# Patient Record
Sex: Female | Born: 1970 | Race: White | Hispanic: No | Marital: Married | Smoking: Never smoker
Health system: Southern US, Community
[De-identification: ages and names within clinical notes are randomized; demographics above are authoritative.]

## PROBLEM LIST (undated history)

## (undated) DIAGNOSIS — I471 Supraventricular tachycardia: Secondary | ICD-10-CM

## (undated) DIAGNOSIS — Z98891 History of uterine scar from previous surgery: Secondary | ICD-10-CM

## (undated) HISTORY — DX: History of uterine scar from previous surgery: Z98.891

## (undated) HISTORY — DX: Supraventricular tachycardia: I47.1

---

## 2006-01-25 ENCOUNTER — Encounter: Admission: RE | Admit: 2006-01-25 | Discharge: 2006-01-25 | Payer: Self-pay | Admitting: Obstetrics and Gynecology

## 2006-07-05 ENCOUNTER — Ambulatory Visit: Payer: Self-pay | Admitting: Internal Medicine

## 2006-08-16 ENCOUNTER — Ambulatory Visit: Payer: Self-pay | Admitting: Internal Medicine

## 2006-11-01 ENCOUNTER — Ambulatory Visit: Payer: Self-pay | Admitting: Internal Medicine

## 2006-12-04 ENCOUNTER — Inpatient Hospital Stay (HOSPITAL_COMMUNITY): Admission: AD | Admit: 2006-12-04 | Discharge: 2006-12-04 | Payer: Self-pay | Admitting: Obstetrics and Gynecology

## 2006-12-07 ENCOUNTER — Inpatient Hospital Stay (HOSPITAL_COMMUNITY): Admission: AD | Admit: 2006-12-07 | Discharge: 2006-12-07 | Payer: Self-pay | Admitting: Obstetrics and Gynecology

## 2006-12-07 IMAGING — US US FETAL BPP W/O NONSTRESS
1 series · 14 of 17 positions shown · non-contrast
Comparison: none

OBSTETRICAL ULTRASOUND:

 This ultrasound exam was performed in the [HOSPITAL] Ultrasound Department.  The OB US report was generated in the AS system, and faxed to the ordering physician.  This report is also available in [REDACTED] PACS.

[Series 1: us ob comp +14 wk · 14 of 17 slices shown]
[im 1/17]
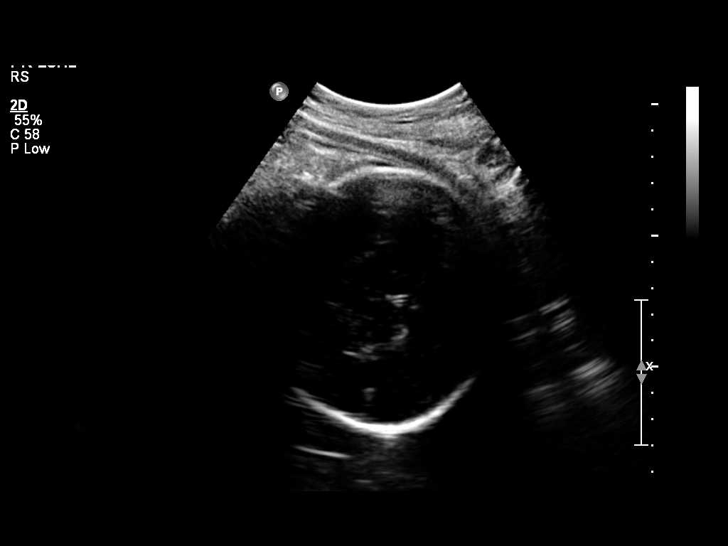
[im 2/17]
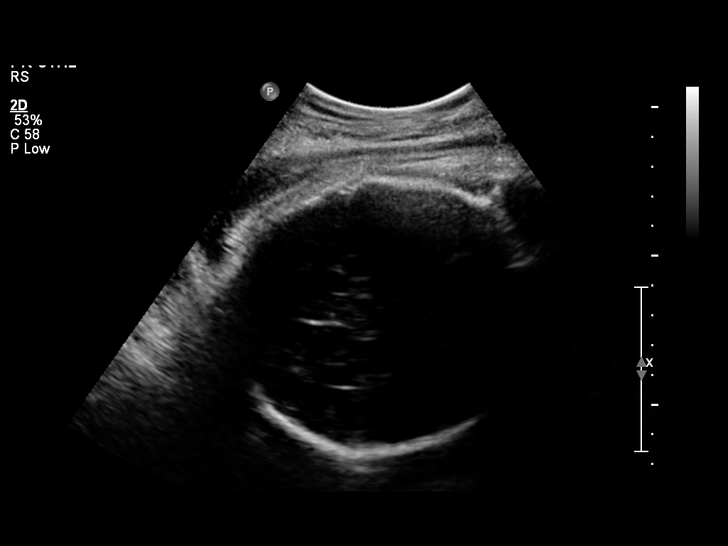
[im 4/17]
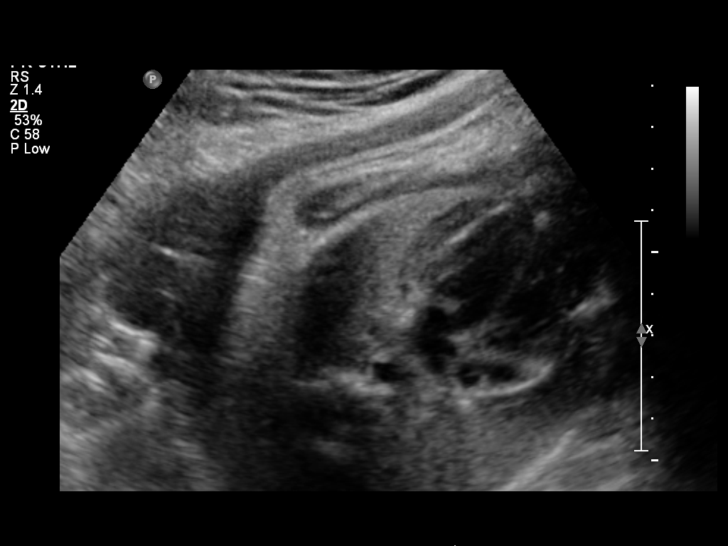
[im 5/17]
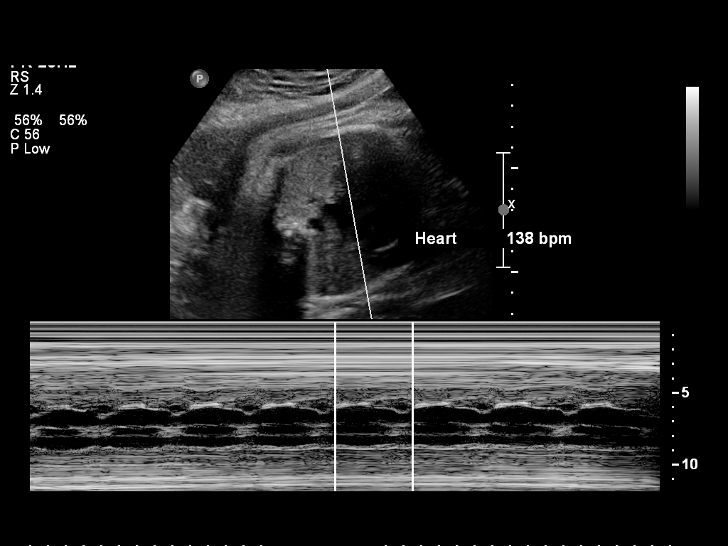
[im 6/17]
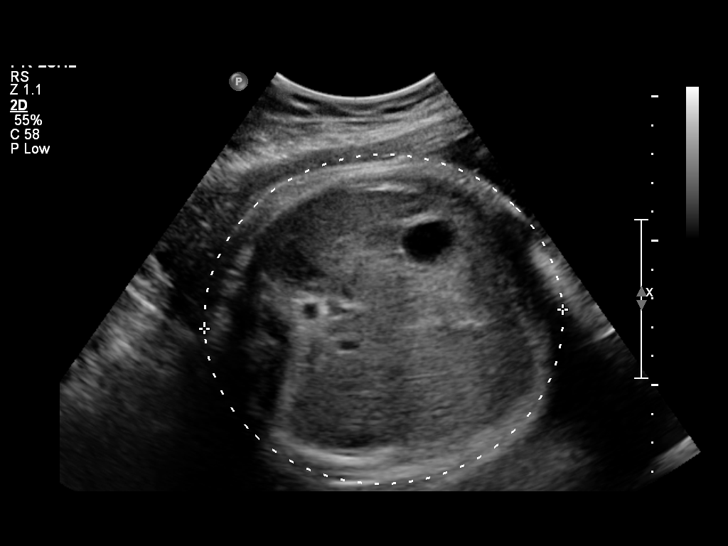
[im 7/17]
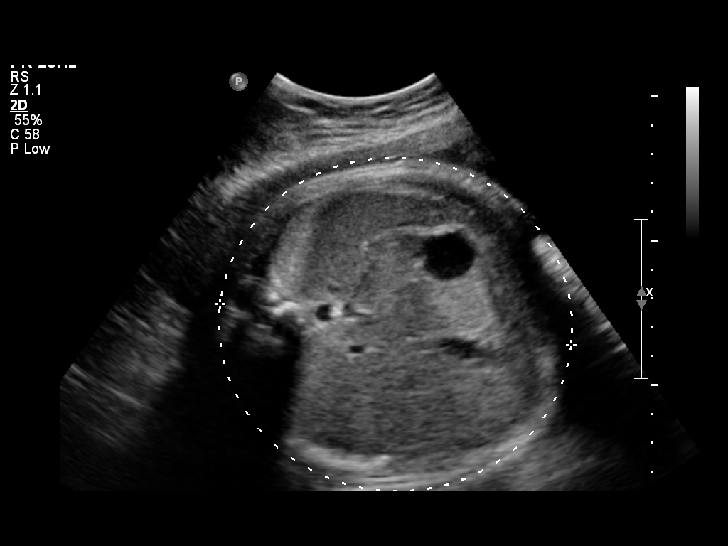
[im 8/17]
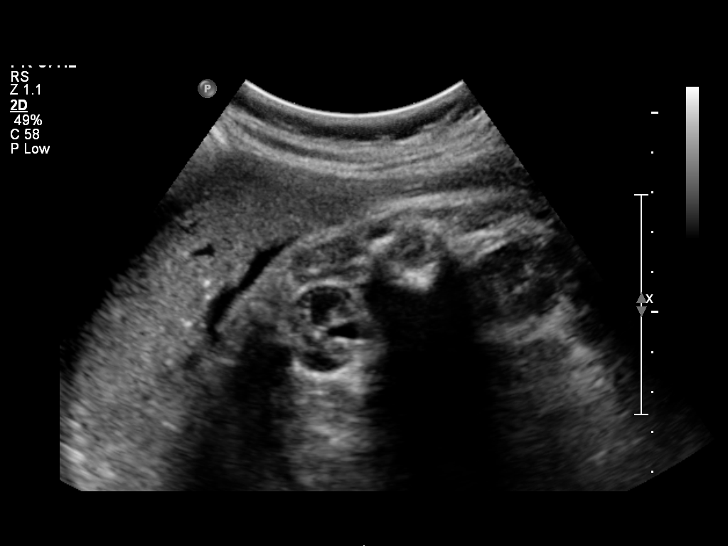
[im 10/17]
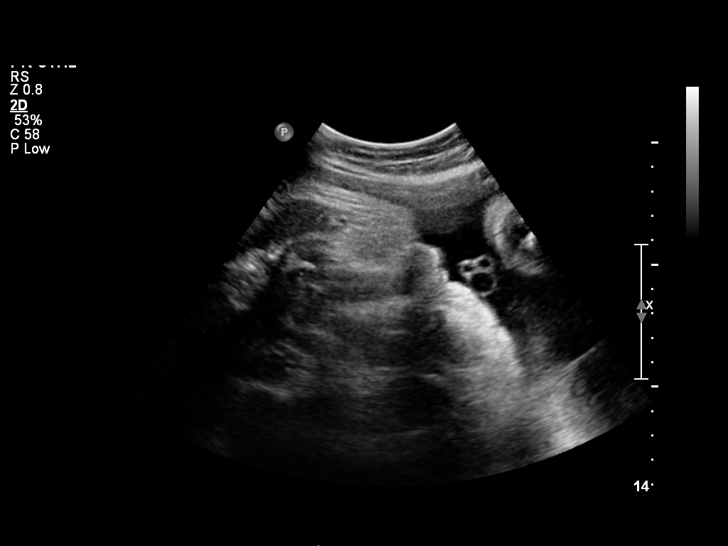
[im 11/17]
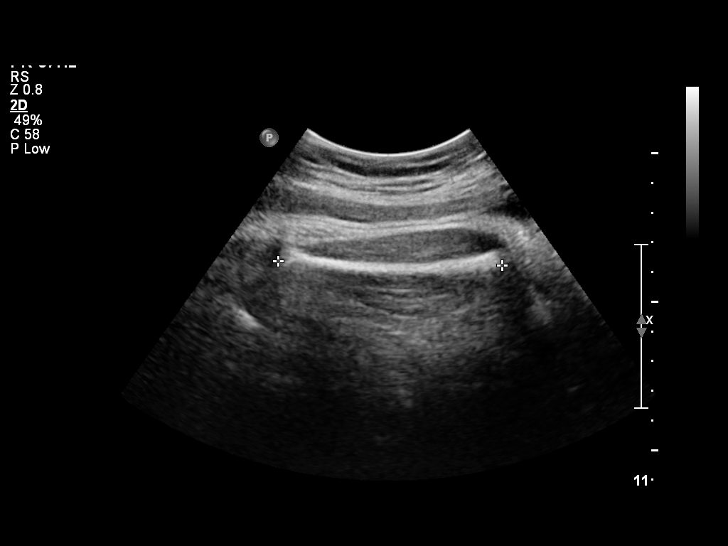
[im 12/17]
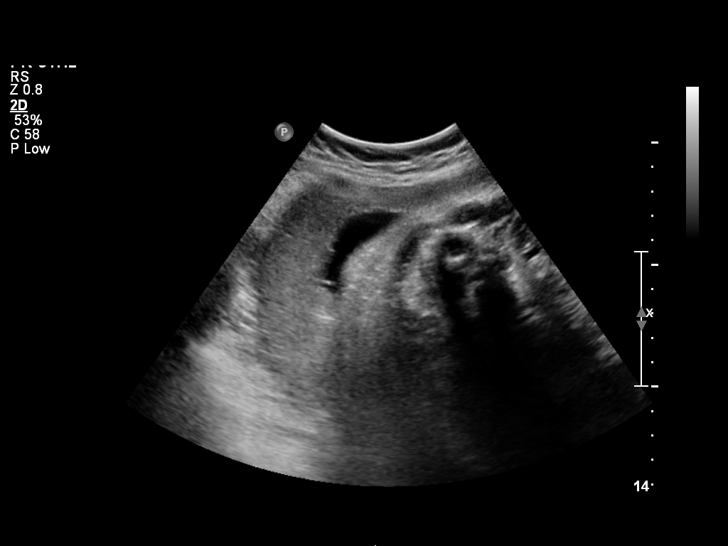
[im 13/17]
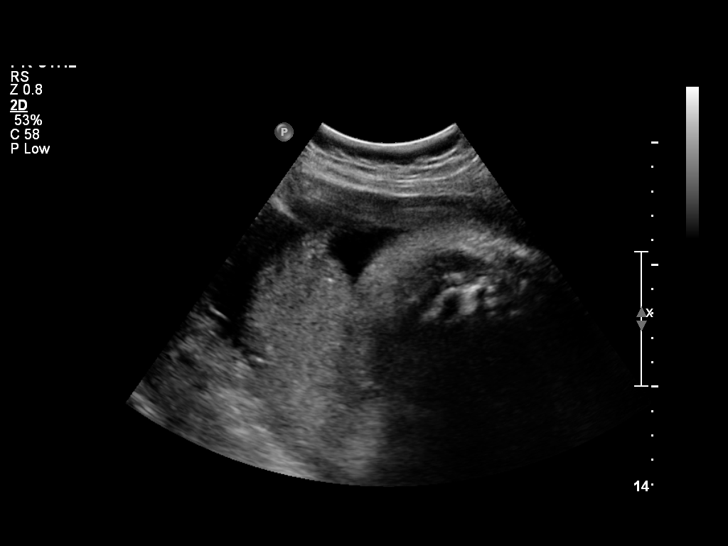
[im 14/17]
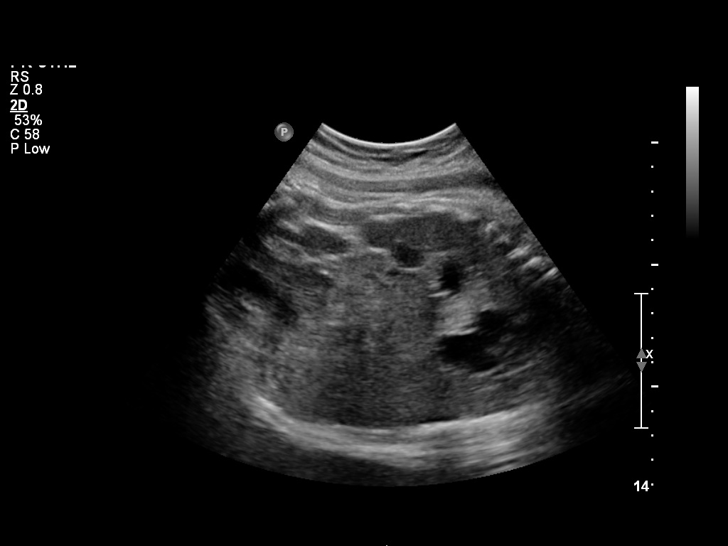
[im 16/17]
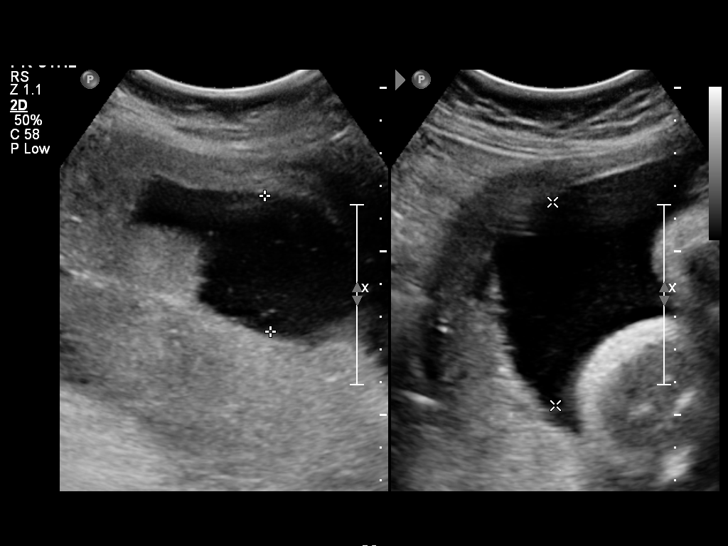
[im 17/17]
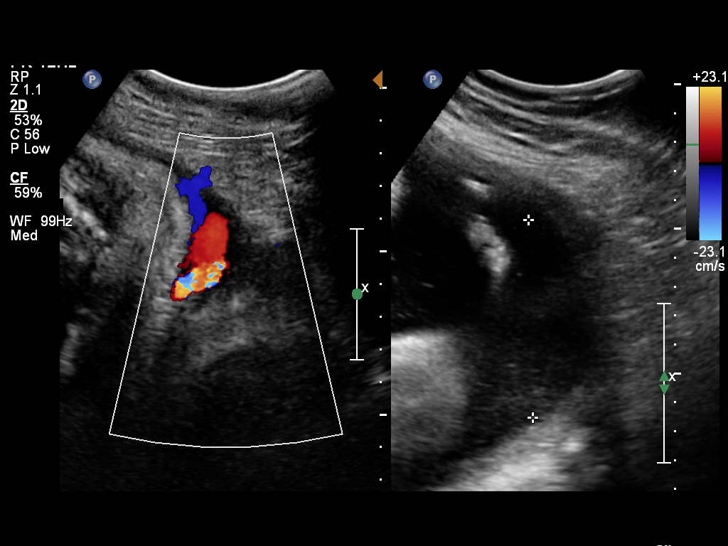

[14 of 17 positions shown; findings below may reference images not displayed]

IMPRESSION: See AS Obstetric US report.

## 2006-12-11 ENCOUNTER — Inpatient Hospital Stay (HOSPITAL_COMMUNITY): Admission: RE | Admit: 2006-12-11 | Discharge: 2006-12-14 | Payer: Self-pay | Admitting: Obstetrics and Gynecology

## 2007-06-12 DIAGNOSIS — I471 Supraventricular tachycardia: Secondary | ICD-10-CM

## 2007-06-12 HISTORY — PX: CARDIAC ELECTROPHYSIOLOGY STUDY AND ABLATION: SHX1294

## 2007-06-12 HISTORY — DX: Supraventricular tachycardia: I47.1

## 2007-10-27 ENCOUNTER — Ambulatory Visit: Payer: Self-pay | Admitting: Internal Medicine

## 2008-03-19 ENCOUNTER — Ambulatory Visit (HOSPITAL_COMMUNITY): Admission: RE | Admit: 2008-03-19 | Discharge: 2008-03-19 | Payer: Self-pay | Admitting: Internal Medicine

## 2008-03-19 ENCOUNTER — Ambulatory Visit: Payer: Self-pay | Admitting: Internal Medicine

## 2009-01-19 DIAGNOSIS — Z98891 History of uterine scar from previous surgery: Secondary | ICD-10-CM

## 2009-01-19 HISTORY — DX: History of uterine scar from previous surgery: Z98.891

## 2009-03-10 ENCOUNTER — Ambulatory Visit: Payer: Self-pay | Admitting: Internal Medicine

## 2009-03-10 DIAGNOSIS — I471 Supraventricular tachycardia, unspecified: Secondary | ICD-10-CM | POA: Insufficient documentation

## 2009-03-10 DIAGNOSIS — I498 Other specified cardiac arrhythmias: Secondary | ICD-10-CM

## 2010-05-01 ENCOUNTER — Telehealth (INDEPENDENT_AMBULATORY_CARE_PROVIDER_SITE_OTHER): Payer: Self-pay | Admitting: *Deleted

## 2010-07-11 NOTE — Progress Notes (Signed)
  Phone Note Other Incoming   Request: Send information Summary of Call: Request received from [facility] forwarded to [facility].  .    

## 2010-08-01 ENCOUNTER — Telehealth (INDEPENDENT_AMBULATORY_CARE_PROVIDER_SITE_OTHER): Payer: Self-pay | Admitting: *Deleted

## 2010-08-08 NOTE — Progress Notes (Addendum)
  ParaMeds Request received sent to [facility]  August 01, 2010 11:29 AM    Appended Document:  EMSI request received sent to [facility]

## 2010-10-24 NOTE — Op Note (Signed)
NAMEJOYLYNN, Shannon Harvey          ACCOUNT NO.:  [id number]   MEDICAL RECORD NO.:  [id number]          PATIENT TYPE:  OIB   LOCATION:  2023                         FACILITY:  [facility]   PHYSICIAN:  Doylene Canning. 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   INTRODUCTION:  The patient is a 40 year old woman with a history of  tachy palpitations and she was adolescent.  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  The patient has had  persistent episodes of SVT which were quite severe when she was pregnant  with her first child.  The patient is now interested in getting pregnant  again and is referred for electrophysiologic study and catheter  ablation.   PROCEDURE:  After informed consent was obtained, the patient was taken  diagnostic EP Lab in a fasting state.  After usual preparation and  draping, intravenous fentanyl and midazolam was given for sedation.  A 6-  [place] hexapolar catheter was inserted percutaneously in the right  jugular vein and advanced under fluoroscopic guidance to the coronary  sinus.  A 5-French quadripolar catheter was inserted percutaneously in  the right femoral vein and advanced to the RV septum.  A 5-French  quadripolar catheter was inserted percutaneously in the right femoral  vein and advanced the His bundle region.  After measurement of basic  intervals, rapid ventricular pacing was carried out from the RV apex and  stepwise decreased down to 580 milliseconds where VA Wenckebach was  observed.  During rapid ventricular pacing, the atrial activation  sequence was midline and  decremental.  Next, programmed ventricular  stimulation was carried out from the RV apex at base drive cycle length  of 540 milliseconds.  The S1-S2 interval stepwise decreased down to 530  milliseconds where retrograde AV node ERP was observed.  During  programmed ventricular stimulation, the atrial activation was midline  and decremental.  Next, programmed atrial stimulation was carried out  from the coronary sinus in high right atrium at base drive cycle length  of 981 milliseconds.  The S1-S2 interval stepwise decreased from 500  milliseconds down to 370 milliseconds where AV node ERP was observed.  During programmed atrial stimulation, there were multiple AH jumps and  only rare echo beats noted.  The PR interval was greater than the RR  interval.  Next, rapid atrial pacing was carried out from the coronary  sinus at base drive cycle length of 191 milliseconds stepwise decreased  down to 430 milliseconds where AV Wenckebach was observed.  During rapid  atrial pacing, the PR interval was greater than the RR interval.  At  this point, isoproterenol was infused at a rate of 1 mcg per minute.  Additional rapid atrial pacing was carried out resulting in the  initiation of SVT (AV node reentrant tachycardia) which was at a cycle  length of approximately around 420 milliseconds.  PVCs placed at time of  His bundle refractures did not preset the atrium and during ventricular  pacing, the tachycardia was terminated during V-pacing.  With this  information in hand, diagnosis of AV node reentrant tachycardia was  made.  Isoproterenol was discontinued, and a 7-French quadripolar  ablation catheter was inserted percutaneously in the right femoral vein  and advanced to right atrium.  Mapping was carried out in [place] triangle.  In the slow pathway region between sites 8 and 10, a total of 2 RF  energy applications were delivered.  During RF energy application, there  was accelerated junctional  rhythm.  Following RF energy application,  additional attempts to re-induce the tachycardia while on isoproterenol  were carried out and this demonstrated that the PR interval was now less  than the RR interval and in fact, there was no evidence of any residual  slow pathway conduction.  At this point, the catheters were removed  after the patient was observed for approximately 10 minutes.  Hemostasis  was assured, and the patient was returned to her room in satisfactory  condition.   COMPLICATIONS:  There were no immediate procedural complications.   RESULTS:  1. Baseline ECG:  Baseline ECG demonstrates sinus rhythm with normal      axis and intervals.  2. Baseline intervals:  Sinus node cycle length was 1055 milliseconds.      The QRS duration was 70 milliseconds.  The PR interval was 170      milliseconds.  3. Rapid ventricular pacing.  Rapid ventricular pacing was carried out      from the RV apex and stepwise decreased down to 580 milliseconds      where VA Wenckebach was observed.  During rapid ventricular pacing,      the atrial activation was midline and decremental.  4. Deep programmed ventricular stimulation.  Programmed ventricular      dilation was carried out the RV apex at base drive cycle length of      600 milliseconds.  The S1-S2 interval was stepwise decreased down      to 530 milliseconds where retrograde AV node ERP was observed.      During programmed ventricular stimulation, the atrial activation      was midline and decremental.  5. Rapid atrial pacing.  Rapid atrial pacing was carried out from the      coronary sinus and high right atrium at a base drive cycle length      of 600 milliseconds, was stepwise decreased down to 430      milliseconds where AV Wenckebach was observed.  Prior to RF energy      application, pacing down near the AV Wenckebach cycle length      resulted in the PR interval being greater than the RR interval, but      there was no induced  SVT.  Following isoproterenol infusion, rapid      atrial pacing resulted in easily inducible SVT.  6. Programmed atrial stimulation.  Programmed atrial stimulation was      carried out from the coronary sinus in high atrium at base drive      cycle length of 600 milliseconds.  The S1-S2 interval was stepwise      decreased from 500 milliseconds down to 370 milliseconds with AV      node ERP was observed.  During  programmed atrial stimulation, there      were multiple AH jumps and echo beats.  Following ablation, there      were none.   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      Doylene Canning. Ladona Ridgel, MD  Electronically Signed     GWT/MEDQ  D:  03/19/2008  T:  03/20/2008  Job:  [id number]   cc:   Marcelino Duster L. Vincente Poli, M.D.

## 2010-10-24 NOTE — Op Note (Signed)
Shannon Harvey, Shannon Harvey          ACCOUNT NO.:  [id number]   MEDICAL RECORD NO.:  [id number]          PATIENT TYPE:  INP   LOCATION:  9148                          FACILITY:  [facility]   PHYSICIAN:  Dineen Kid. Rana Snare, M.D.    DATE OF BIRTH:  December 10, 1970   DATE OF PROCEDURE:  12/11/2006  DATE OF DISCHARGE:                               OPERATIVE REPORT   PREOPERATIVE DIAGNOSIS:  Intrauterine pregnancy at 38 weeks, arrest of  descent.   POSTOPERATIVE DIAGNOSIS:  Intrauterine pregnancy at 38 weeks, arrest of  descent plus macrosomia.   PROCEDURES:  Primary low segment transverse cesarean section.   SURGEON:  Dr. Candice Camp.   ANESTHESIA:  Was epidural.   INDICATIONS:  The patient is a 41 year old G1 at 66 weeks who is  admitted for induction of labor due to a large gestational age baby and  favorable cervix.  She progressed to complete, pushed for 2-1/2 hours  and was unable to get the fetal vertex below +1 station.  We proceeded  with primary low segment transverse cesarean section for arrest of  descent.   FINDINGS:  Viable female infant, Apgars were 09/09, pH arterial 7.32,  weight is 8 pounds 12 ounces.   DESCRIPTION OF PROCEDURE:  After adequate analgesia the patient placed  in the supine position left lateral tilt.  She is sterilely prepped and  draped.  Bladder sterilely drained with Foley catheter.  The  Pfannenstiel skin incision was made two fingerbreadths above the pubic  symphysis taken down sharply to fascia which was incised transversely  and superiorly and extended inferiorly off the bellies of the rectus  muscle which were separated sharply in midline.  Peritoneum was entered  sharply.  Bladder flap created and placed behind the bladder blade.  Low  segment myotomy incision was made down to vertex.  The incision was  extended laterally, fetal vertex was delivered atraumatically.  The  nares and pharynx were suctioned.  Nuchal cord x1 was reduced.  Cord  clamped, cut and  handed to the pediatrician for resuscitation.  The cord  blood was obtained.  Placenta extracted manually.  The uterus was  exteriorized, wiped clean with a dry lap.  The myotomy incision closed  in two layers, first being a running locking layer, the second being  imbricating layer of 0 Monocryl suture.  Uterus was placed back in the  peritoneal cavity and after copious amount of irrigation adequate  hemostasis was assured.  Peritoneum was closed with 0 Monocryl.  Rectus  muscle plicated in midline.  Irrigation applied and after adequate  hemostasis the fascia was closed with a #1 Vicryl in a running fashion.  Irrigation applied and after hemostasis, skin stapled.  Steri-Strips  applied.  The patient tolerated procedure well, was stable on transfer  to recovery room.  Sponge and instrument count was normal x3.  Estimated  blood loss was 600 mL. The patient received 1 gram Rocephin after  delivery placenta.      Dineen Kid Rana Snare, M.D.  Electronically Signed     DCL/MEDQ  D:  12/11/2006  T:  12/11/2006  Job:  [id number]

## 2010-10-24 NOTE — Assessment & Plan Note (Signed)
[facility]                         ELECTROPHYSIOLOGY OFFICE NOTE   Shannon Harvey, FULMORE                   MRN:          [id number]  DATE:11/01/2006                            DOB:          21-Nov-1970    Ms. Shannon Harvey returns today for followup.  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  The patient states that despite being on Toprol XL 50 a day, she  continues to have episodes of tachy palpitations, which will awaken her  from sleep tonight.  Typically, a vagal maneuver or just a deep breath  will result in termination, as she easily falls back to sleep.  She  denies any syncope.  She denies shortness of breath.  She denies  peripheral edema or hypertension.  She tells me that her baby's heart  rate has been well up in the 150s to 160s range, which apparently is  normal for this period of pregnancy.   Her medications include metoprolol XL 50 a day and prenatal vitamins.   PHYSICAL EXAMINATION:  GENERAL:  Notable for a pleasant, well-appearing  40 year old 34-week pregnant woman in no acute distress.  VITAL SIGNS:  Blood pressure 122/72, pulse 80 and regular, respirations  18.  Weight was 230 pounds.  NECK:  No jugular venous distention.  LUNGS:  Clear bilaterally to auscultation.  There are no wheezes, rales  or rhonchi.  CARDIOVASCULAR:  Regular rate and rhythm with a normal S1 and S2.  EXTREMITIES:  No clubbing or cyanosis. There was a very trace amount of  peripheral edema.   EKG demonstrates a sinus rhythm with sinus arrhythmia.  The P-R interval  is normal.   IMPRESSION:  1. Recurrent supraventricular tachycardia.  2. A 34-week gestation.   DISCUSSION:  Overall, Shannon Harvey is stable.  I have recommended  that she continue on with her Toprol at the present time.  She could  decrease the dose to 25 mg a day around the time of her expected  delivery.  If she ultimately  ends up developing worsening SVT, then she  could certainly go ahead and go back up to 50 mg daily, provided there  was no evidence of fetal bradycardia.  At this point, I have not  recommended followup until after she has delivered.  Should she desire  to proceed with catheter ablation following the delivery of her child,  then we would certainly be happy to arrange this, and I have asked her  to call after her baby is born so we could meet about proceeding with  ablation therapy.     Doylene Canning. Ladona Ridgel, MD  Electronically Signed   GWT/MedQ  DD: 11/01/2006  DT: 11/01/2006  Job #: [id number]   cc:   Marcelino Duster L. Vincente Poli, M.D.

## 2010-10-24 NOTE — Assessment & Plan Note (Signed)
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  Shannon Harvey is a very pleasant  young woman with a history of SVT which flared up fairly substantially  during her pregnancy.  Shannon Harvey was placed on beta blockers and had initially  fairly good control of her SVT.  Shannon Harvey returns today for followup.  The  patient's EKG demonstrates no pre-excitation.  Shannon Harvey does have  palpitations which occur daily now that Shannon Harvey is no longer pregnant.  Shannon Harvey  is able to terminate these with Valsalva maneuvers.  Shannon Harvey has no other  specific complaints today.  Her daughter, who is with her today, is 25  months old and is doing quite well.  Shannon Harvey is  currently on no  medications. During pregnancy, Shannon Harvey was on metoprolol at a low-dose.   PHYSICAL EXAMINATION:  GENERAL:  Shannon Harvey is a pleasant young woman in no  distress.  VITAL SIGNS:  Blood pressure 118/75, pulse 58 and regular, respirations  were 18.  Weight was 199 pounds.  NECK:  Revealed no jugular venous distention.  LUNGS:  Clear bilaterally to auscultation.  No wheezes, rales or rhonchi  are present.  CARDIOVASCULAR:  Regular rate and rhythm with normal S1-S2. No murmurs,  rubs or gallops present.  EXTREMITIES:  Demonstrate no cyanosis, clubbing or edema.  Pulses were  2+ symmetric.   Her EKG demonstrates sinus rhythm with sinus bradycardia.  There no  other significant findings.   IMPRESSION:  Recurrent supraventricular tachycardia.   DISCUSSION:  I have discussed treatment options with Dr. Ihor Gully in  detail. The risks, benefits, goals, and expectations of catheter  ablation of her SVT were discussed, and Shannon Harvey wishes to proceed. This will  be scheduled for the earliest possible convenient time.     Doylene Canning. Ladona Ridgel, MD  Electronically Signed    GWT/MedQ  DD: 10/27/2007  DT: 10/27/2007  Job #: [id number]   cc:   Marcelino Duster L. Vincente Poli, M.D.

## 2010-10-27 NOTE — Assessment & Plan Note (Signed)
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  She is a very pleasant, 40 year old woman,  who has a history of tachy palpitations and documented SVT, but is also  five months pregnant, returns today for followup.  I initially had seen  her back in January, when she had been recently diagnosed with being  pregnant, but also had recurrent episodes of SVT.  At that time, we  placed her initially on beta blockers, initially starting her at 25 mg a  day of Toprol and increasing the dose to 50 mg a day.  She states that,  with the initiation of her beta blockers, her palpitations were reduced  in frequency and then were eliminated altogether with the initiation of  50 mg a day of long-acting metoprolol.  The patient denies chest pain,  has otherwise been stable with regard to her pregnancy.   PHYSICAL EXAM:  She is a pleasant, well-appearing, young woman, in no  acute distress.  Her blood pressure was 128/72, the pulse 78 and  regular, respirations were 18, the weight was 213 pounds.  NECK:  Revealed no jugular venous distention.  LUNGS:  Clear bilaterally to auscultation, no wheezes, rales or rhonchi.  CARDIOVASCULAR EXAM:  Revealed a regular rate and rhythm with normal S1  and S2.  EXTREMITIES:  Demonstrated no cyanosis, clubbing or edema.   MEDICATIONS INCLUDE:  1. Prenatal vitamin.  2. Metoprolol.  3. Lipil.   IMPRESSION:  1. Recurrent supraventricular tachycardia.  2. Five and a half-month gestation.   DISCUSSION:  Overall, Shannon Harvey is doing quite well and tolerating  her beta blocker very nicely and has had no significant SVT since we  last saw her.  I have recommended that she  continue on metoprolol XL 50  mg daily.  Several days prior to her delivery, we might consider  decreasing her dose to a half tablet daily.  The patient does have an  interest in proceeding with catheter ablation, which we will tentatively  plan to do approximately one month to six weeks after her pregnancy.     Shannon Canning. Ladona Ridgel, MD  Electronically Signed   GWT/MedQ  DD: 08/16/2006  DT: 08/16/2006  Job #: [id number]   cc:   Marcelino Duster L. Vincente Poli, M.D.

## 2010-10-27 NOTE — Assessment & Plan Note (Signed)
[facility]                         ELECTROPHYSIOLOGY OFFICE NOTE   Shannon Harvey, Shannon Harvey                   MRN:          [id number]  DATE:07/05/2006                            DOB:          01-21-1971    Ms. Augustus is referred today by Dr. Marcelle Overlie for evaluation  of SVT.  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  She is approximately 3-1/2  months pregnant.  She has a longstanding history of SVT which was  diagnosed initially in 1992 when she underwent an electrophysiology  study at the [facility].  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  The patient has been very stable with regard to her  symptomatic tachy palpitations.  These would typically be very rare,  occurring once every 2-3 years.  The patient has recently become  pregnant and since then, she has noted that her tachy palpitations have  increased in frequency and severity.  She does not have chest pain  particularly, or shortness of breath with these.  She does feel somewhat  fatigued.  They start and stop suddenly and can be terminated with vagal  maneuvers.  She is presently on no medications.   Additional past medical history is really notable for laparoscopy in  2005 secondary to endometriosis and hysteroscopy for removal of a  fibroid.   She has a family history notable for father with bypass surgery at age  32.  Her mother is alive and well at age 61.  She has multiple siblings  who are in good health.   On review of systems, she notes seasonal allergic rhinitis and  occasional menstrual dysfunction.  Of note, she is in the start of her  second trimester of pregnancy.  The rest of her review of systems was  negative except as noted in the HPI.   PHYSICAL EXAMINATION:  GENERAL:  She is a pleasant, well-appearing  young  woman in no distress.  VITAL SIGNS:  Blood pressure was 130/80, pulse 74 and regular,  respirations 18.  Weight was 202 pounds.  HEENT:  Normocephalic and atraumatic.  Pupils are equal and round.  Oropharynx was moist.  Sclerae are anicteric.  NECK:  No jugular venous distention.  There is no thyromegaly.  Trachea  is midline.  Carotids are 2+ and symmetric.  LUNGS:  Clear bilaterally to auscultation.  No wheezes, rales, or  rhonchi.  There is no increased work of breathing.  CARDIOVASCULAR:  Regular rate and rhythm with normal S1 and S2.  I do  not appreciate any murmurs, rubs or gallops.  ABDOMEN:  Soft and nontender.  There was no obvious organomegaly.  EXTREMITIES:  No clubbing, cyanosis or edema.  Pulses were 2+ and  symmetric.  NEUROLOGIC:  Alert and oriented x3 with cranial nerves intact.  Strength  is 5/5 and symmetric.   Her EKG demonstrates sinus rhythm with normal axis and intervals.  There  was sinus arrhythmia present.   IMPRESSION:  1. Recurrent supraventricular tachycardia, although  I do not have any      documentation of this, I have an electrophysiology study that      demonstrates inducible atrioventricular node reentry back in 1992,      for which she did not undergo catheter ablation, as at that time      the procedure was not very well understood and success rates were      not as good either.  2. Early second trimester pregnancy.  3. Borderline hypertension.   DISCUSSION:  I have discussed the treatment options with the patient,  and I have recommended that we start her on Toprol and will try her on  25 mg daily, increasing her to 50 mg daily, as necessary to control her  arrhythmias and for her blood pressure, as it tolerates it.  I will plan  to see her back in approximately six weeks.  Hopefully, her palpitations  will be well controlled, and particularly if the patient desires  additional pregnancies, catheter ablation of her tachycardia would  be  warranted.  I have discussed all of the issues with the patient and will  plan to see her back at that time.  If she has worsening tachycardic  palpitations, then she is instructed to call.  If her blood pressure  increases further, then we would also want to hear about this as well.     Doylene Canning. Ladona Ridgel, MD  Electronically Signed    GWT/MedQ  DD: 07/05/2006  DT: 07/05/2006  Job #: [id number]   cc:   Marcelino Duster L. Vincente Poli, M.D.

## 2010-10-27 NOTE — Discharge Summary (Signed)
NAMEAIMAN, Shannon Harvey          ACCOUNT NO.:  [id number]   MEDICAL RECORD NO.:  [id number]          PATIENT TYPE:  INP   LOCATION:  9141                          FACILITY:  [facility]   PHYSICIAN:  Zelphia Cairo, MD    DATE OF BIRTH:  Jan 15, 1971   DATE OF ADMISSION:  12/11/2006  DATE OF DISCHARGE:  12/14/2006                               DISCHARGE SUMMARY   ADMITTING DIAGNOSES:  1. Intrauterine pregnancy at 86 weeks estimated gestational age.  2. Induction of labor secondary to large for gestational age baby with      favorable cervix.   DISCHARGE DIAGNOSES:  1. Status post low transverse cesarean section.  2. Viable female infant.   PROCEDURE:  Primary low transverse cesarean section.   REASON FOR ADMISSION:  Please see written H&P.   HOSPITAL COURSE:  The patient is 33-year primigravida that was admitted  to [facility] at 38 weeks estimated gestational age for  an induction of labor secondary to a large for gestational age baby with  a favorable cervix.  On admission, vital signs were stable.  Fetal heart  tones in the 140s with acceleration.  Cervix was examined and found to  be 3 cm dilated, 75% effaced, vertex at a -3 station.  Artificial  rupture of membranes was performed revealing clear fluid.  The patient  did progress to full dilatation and after pushing for approximately 2-  1/2 hours without further descent beyond a 1+ station, decision was made  to proceed with a primary low transverse cesarean section.  The patient  was then transferred to the operating room where epidural was dosed to  an adequate surgical level.  A low transverse incision was made with  delivery of a viable female infant weighing 8 pounds 12 ounces, Apgars  of 9 at 1 and 9 at 5 minutes.  Arterial cord pH of 7.32.  The patient  tolerated the procedure well and was taken to the recovery room in  stable condition.   On postoperative day #1, the patient was without complaint.  Vital  signs  were stable.  Fundus was firm and nontender.  Abdominal dressing was  noted to be clean, dry and intact.  Laboratory findings revealed  hemoglobin of 10.6.  On postoperative day #2, the patient was without  complaint.  Vital signs remained stable.  She was afebrile.  Abdomen  soft.  Fundus firm and nontender.  Abdominal dressing had been removed  revealing an incision that was clean, dry and intact.  Postoperative day  #3, the patient was without complaint.  Vital signs remained stable.  She was  afebrile.  Fundus firm and nontender.  Incision was clean, dry  and intact.  Staples removed and the patient was later discharged home.   CONDITION ON DISCHARGE:  Stable.   DIET:  Regular as tolerated.   ACTIVITY:  No heavy lifting, no driving x2 weeks, no vaginal entry.   FOLLOW UP:  Patient to follow up in the office in one to two weeks for  incision check.  She is to call for temperature greater than 100  degrees, persistent nausea,  vomiting, heavy vaginal bleeding and/or  redness or drainage from incisional site.   DISCHARGE MEDICATIONS:  1. Percocet 5/325 #31 p.o. q.4-6h. p.r.n.  2. Motrin 600 mg every six hours.  3. Prenatal vitamins one p.o. daily.  4. Colace one p.o. daily p.r.n.      Julio Sicks, N.P.      Zelphia Cairo, MD  Electronically Signed    CC/MEDQ  D:  01/15/2007  T:  01/15/2007  Job:  [phone] [id number]

## 2010-10-27 NOTE — Letter (Signed)
July 05, 2006    Michelle L. Vincente Poli, M.D.  [address], [address]  [place], [place] [address]   RE:  [name], [name]  MRN:  [id number]  /  DOB:  January 02, 1971   Dear Marcelino Duster,   Thank you for referring Shannon Harvey for EP evaluation.  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  The patient's  symptoms have been very quiescent for many years, but since she became  pregnant, she has noticed increasingly frequent episodes of SVT, which  are typically short-lived, stopping with vagal maneuvers that she self-  directs.  Her exam was otherwise fairly unremarkable today, and her EKG  demonstrates sinus rhythm with no evidence of ventricular pre-  excitation.  Interestingly enough, her blood pressure was 130/80 in our  office, although I wonder whether it is normally increased.   I discussed the treatment options with Malyah in detail.  I recommended  that we start her on Toprol XL at 25 mg daily initially with increases  to 50 mg daily and if well tolerated, continue this throughout her  pregnancy with the potential for stopping the medication, perhaps one  week prior to delivery.  I have had experience with this dose of  medication for patients with symptomatic SVT and have had good success  in the past.  I plan to see the patient back in six weeks for  followup and medication adjustment as needed.  Please do not hesitate to  contact me for any additional questions or problems.  Thank you for  letting me help take care of [name].   I should note that after her delivery, we would recommend catheter  ablation of her SVT.    Sincerely,      Doylene Canning. Ladona Ridgel, MD  Electronically Signed    GWT/MedQ  DD: 07/05/2006  DT: 07/05/2006  Job #: [id number]

## 2011-03-27 LAB — CBC
HCT: 31.4 — ABNORMAL LOW
HCT: 34.7 — ABNORMAL LOW
Hemoglobin: 10.6 — ABNORMAL LOW
Hemoglobin: 11.9 — ABNORMAL LOW
MCHC: 33.8
MCHC: 34.3
MCV: 82.7
Platelets: 254
RBC: 4.2
RDW: 14.1 — ABNORMAL HIGH
RDW: 14.2 — ABNORMAL HIGH
WBC: 9.9

## 2016-07-04 ENCOUNTER — Ambulatory Visit (INDEPENDENT_AMBULATORY_CARE_PROVIDER_SITE_OTHER): Payer: Managed Care, Other (non HMO) | Admitting: Family Medicine

## 2016-07-04 ENCOUNTER — Encounter: Payer: Self-pay | Admitting: Family Medicine

## 2016-07-04 VITALS — BP 109/66 | HR 70 | Ht 69.0 in | Wt 199.0 lb

## 2016-07-04 DIAGNOSIS — R42 Dizziness and giddiness: Secondary | ICD-10-CM | POA: Diagnosis not present

## 2016-07-04 DIAGNOSIS — Z13220 Encounter for screening for lipoid disorders: Secondary | ICD-10-CM

## 2016-07-04 DIAGNOSIS — R7309 Other abnormal glucose: Secondary | ICD-10-CM

## 2016-07-04 NOTE — Progress Notes (Signed)
Subjective:    Patient ID: Shannon Harvey, female    DOB: 05/27/1971, 46 y.o.   MRN: [id number]  HPI 46 year old female comes in to establish care today. She does have a specific concern about some intermittent lightheadedness that she's been experiencing for about 2 months. Each time it only last for a few seconds.It is not related to change in position. She says when it's happened she's been able to check her glucose and her blood pressure at work and it's always normal. She denies any recent injury or trauma to the head. She has not had significant headaches with this. NO other neurologic sxs like numbness tingling or weakness. No recent vision changes.  Eye exam is up to date.  No recent URI but did have a lower respiratory tract infection a couple of moths ago.  No fever, chills ro sweats. No ear pain or throat pain.    She also has had a couple of episodes in her left ear where she could actually hear her heart beating. It wasn't painful.   Review of Systems  Constitutional: Negative for diaphoresis, fever and unexpected weight change.  HENT: Negative for hearing loss, rhinorrhea and tinnitus.   Eyes: Negative for visual disturbance.  Respiratory: Negative for cough and wheezing.   Cardiovascular: Negative for chest pain and palpitations.  Gastrointestinal: Negative for blood in stool, diarrhea, nausea and vomiting.  Genitourinary: Negative for difficulty urinating, vaginal bleeding and vaginal discharge.  Musculoskeletal: Positive for arthralgias. Negative for myalgias.       Left ankle - gettign surgery in Feb  Skin: Negative for rash.  Neurological: Negative for headaches.  Hematological: Negative for adenopathy. Does not bruise/bleed easily.  Psychiatric/Behavioral: Negative for dysphoric mood and sleep disturbance. The patient is not nervous/anxious.    No sinus issues. No other neurologic issues.   BP 109/66   Pulse 70   Ht 5\' 9"  (1.753 m)   Wt 199 lb (90.3 kg)    SpO2 99%   BMI 29.39 kg/m     Allergies not on file  Past Medical History:  Diagnosis Date  . H/O: C-section 01/19/2009  . SVT (supraventricular tachycardia) (HCC) 2009   Treated with ablation    Past Surgical History:  Procedure Laterality Date  . CARDIAC ELECTROPHYSIOLOGY STUDY AND ABLATION  2009  . CESAREAN SECTION  12/11/2006  . CESAREAN SECTION  01/19/2009    Social History   Social History  . Marital status: Married    Spouse name: Acupuncturistcott   . Number of children: 2  . Years of education: N/A   Occupational History  . Oral Surgeon    Social History Main Topics  . Smoking status: Never Smoker  . Smokeless tobacco: Never Used  . Alcohol use No  . Drug use: No  . Sexual activity: Yes    Partners: Male    Birth control/ protection: IUD   Other Topics Concern  . Not on file   Social History Narrative   3 caffeinated beverages per day. Walks for 30 minutes 3 times a week.    Family History  Problem Relation Age of Onset  . Alcoholism Father   . Cancer Father     Merkel Cell Carcinoma  . Heart attack Father   . Hyperlipidemia Father   . Diabetes type II Father   . Hypertension Father   . Stroke Father     No outpatient encounter prescriptions on file as of 07/04/2016.   No facility-administered encounter medications on  file as of 07/04/2016.          Objective:   Physical Exam  Constitutional: She is oriented to person, place, and time. She appears well-developed and well-nourished.  HENT:  Head: Normocephalic and atraumatic.  Right Ear: External ear normal.  Left Ear: External ear normal.  Nose: Nose normal.  Mouth/Throat: Oropharynx is clear and moist.  TMs and canals are clear.   Eyes: Conjunctivae and EOM are normal. Pupils are equal, round, and reactive to light.  Neck: Neck supple. No thyromegaly present.  Cardiovascular: Normal rate, regular rhythm and normal heart sounds.   Pulmonary/Chest: Effort normal and breath sounds normal. She  has no wheezes.  Lymphadenopathy:    She has no cervical adenopathy.  Neurological: She is alert and oriented to person, place, and time. No cranial nerve deficit.  Neg Diks Hallpike manuver  Skin: Skin is warm and dry. No pallor.  Psychiatric: She has a normal mood and affect. Her behavior is normal.  Vitals reviewed.      Assessment & Plan:  Lightheadedness-unclear etiology at this point. The episodes are very brief. She has a negative Dix-Hallpike maneuver. She really doesn't have any significant sinus symptoms. We'll start with some lab work just to rule out thyroid disorder, anemia etc. She could even consider doing an CAT scan of the sinuses just to make sure that there is no sign of chronic sinusitis or fluid or pressure. Her ear exam is normal today so that's reassuring as well. She's really not on any medications that should be causing her symptoms. Just encourage her to make sure that she is hydrating well and may want to limit some of her caffeine intake. Will call with results once available.

## 2016-07-06 ENCOUNTER — Encounter: Payer: Self-pay | Admitting: Family Medicine

## 2016-07-17 LAB — CBC WITH DIFFERENTIAL/PLATELET
BASOS PCT: 0 %
Basophils Absolute: 0 cells/uL (ref 0–200)
EOS ABS: 67 {cells}/uL (ref 15–500)
EOS PCT: 1 %
HCT: 37.9 % (ref 35.0–45.0)
Hemoglobin: 12.3 g/dL (ref 11.7–15.5)
Lymphocytes Relative: 21 %
Lymphs Abs: 1407 cells/uL (ref 850–3900)
MCH: 26.4 pg — ABNORMAL LOW (ref 27.0–33.0)
MCHC: 32.5 g/dL (ref 32.0–36.0)
MCV: 81.3 fL (ref 80.0–100.0)
MONOS PCT: 6 %
MPV: 9.2 fL (ref 7.5–12.5)
Monocytes Absolute: 402 cells/uL (ref 200–950)
NEUTROS ABS: 4824 {cells}/uL (ref 1500–7800)
Neutrophils Relative %: 72 %
PLATELETS: 248 10*3/uL (ref 140–400)
RBC: 4.66 MIL/uL (ref 3.80–5.10)
RDW: 14.1 % (ref 11.0–15.0)
WBC: 6.7 10*3/uL (ref 3.8–10.8)

## 2016-07-17 LAB — HEMOGLOBIN A1C
HEMOGLOBIN A1C: 5.1 % (ref <5.7)
Mean Plasma Glucose: 100 mg/dL

## 2016-07-18 LAB — COMPLETE METABOLIC PANEL WITH GFR
ALT: 26 U/L (ref 6–29)
AST: 17 U/L (ref 10–30)
Albumin: 4.3 g/dL (ref 3.6–5.1)
Alkaline Phosphatase: 41 U/L (ref 33–115)
BILIRUBIN TOTAL: 0.7 mg/dL (ref 0.2–1.2)
BUN: 11 mg/dL (ref 7–25)
CO2: 22 mmol/L (ref 20–31)
CREATININE: 0.83 mg/dL (ref 0.50–1.10)
Calcium: 9.1 mg/dL (ref 8.6–10.2)
Chloride: 102 mmol/L (ref 98–110)
GFR, Est African American: >89 mL/min (ref >=60)
GFR, Est Non African American: 87 mL/min (ref >=60)
GLUCOSE: 89 mg/dL (ref 65–99)
Potassium: 4.1 mmol/L (ref 3.5–5.3)
SODIUM: 139 mmol/L (ref 135–146)
TOTAL PROTEIN: 6.6 g/dL (ref 6.1–8.1)

## 2016-07-18 LAB — LIPID PANEL
Cholesterol: 172 mg/dL (ref <200)
HDL: 45 mg/dL — AB (ref >50)
LDL CALC: 116 mg/dL — AB (ref <100)
Total CHOL/HDL Ratio: 3.8 Ratio (ref <5.0)
Triglycerides: 54 mg/dL (ref <150)
VLDL: 11 mg/dL (ref <30)

## 2016-07-18 LAB — MAGNESIUM: Magnesium: 1.9 mg/dL (ref 1.5–2.5)

## 2016-07-18 LAB — C-PEPTIDE: C PEPTIDE: 1.57 ng/mL (ref 0.80–3.85)

## 2016-07-18 LAB — VITAMIN D 25 HYDROXY (VIT D DEFICIENCY, FRACTURES): VIT D 25 HYDROXY: 41 ng/mL (ref 30–100)

## 2016-07-18 LAB — TSH: TSH: 0.63 m[IU]/L

## 2016-07-18 LAB — INSULIN, FASTING: INSULIN FASTING, SERUM: 7.2 u[IU]/mL (ref 2.0–19.6)

## 2016-07-19 LAB — FOLATE: FOLATE: 14.6 ng/mL (ref >5.4)

## 2016-07-19 LAB — FERRITIN: Ferritin: 14 ng/mL (ref 10–232)

## 2016-07-20 LAB — VITAMIN B6: Vitamin B6: 9 ng/mL (ref 2.1–21.7)

## 2016-07-21 LAB — VITAMIN B1: Vitamin B1 (Thiamine): <7 nmol/L — ABNORMAL LOW (ref 8–30)

## 2016-07-24 ENCOUNTER — Other Ambulatory Visit: Payer: Self-pay | Admitting: *Deleted

## 2016-07-24 DIAGNOSIS — R42 Dizziness and giddiness: Secondary | ICD-10-CM

## 2016-07-24 NOTE — Addendum Note (Signed)
Addended by: Deno EtienneBARKLEY, Colan Laymon L on: 07/24/2016 05:55 PM   Modules accepted: Orders

## 2017-01-15 ENCOUNTER — Encounter: Payer: Self-pay | Admitting: Family Medicine

## 2017-09-05 ENCOUNTER — Telehealth: Payer: Self-pay | Admitting: Family Medicine

## 2017-09-05 DIAGNOSIS — R790 Abnormal level of blood mineral: Secondary | ICD-10-CM

## 2017-09-05 DIAGNOSIS — Z0000 Encounter for general adult medical examination without abnormal findings: Secondary | ICD-10-CM

## 2017-09-05 DIAGNOSIS — E519 Thiamine deficiency, unspecified: Secondary | ICD-10-CM

## 2017-09-05 NOTE — Telephone Encounter (Signed)
Order placed for labs. She can go anytime. Opens at 7:30 downstairs or can do same day.  See if anything else she would like drawn.

## 2017-09-05 NOTE — Telephone Encounter (Signed)
Yearly check up, check PSVT, Ive been symptomatic recently  Should I get my labs drawn prior to my appointment? THanks  Pt sent this msg via mychart, please let me know what she should do so that I can get her appointment scheduled.

## 2017-09-05 NOTE — Telephone Encounter (Signed)
Routing to PCP for review.

## 2017-09-06 NOTE — Telephone Encounter (Signed)
Labs placed. Left VM for Pt with update.

## 2017-11-17 LAB — CBC
HEMATOCRIT: 36.5 % (ref 35.0–45.0)
HEMOGLOBIN: 12.3 g/dL (ref 11.7–15.5)
MCH: 28 pg (ref 27.0–33.0)
MCHC: 33.7 g/dL (ref 32.0–36.0)
MCV: 83 fL (ref 80.0–100.0)
MPV: 9.7 fL (ref 7.5–12.5)
Platelets: 234 10*3/uL (ref 140–400)
RBC: 4.4 10*6/uL (ref 3.80–5.10)
RDW: 12.4 % (ref 11.0–15.0)
WBC: 4.7 10*3/uL (ref 3.8–10.8)

## 2017-11-17 LAB — COMPLETE METABOLIC PANEL WITH GFR
AG RATIO: 2.1 (calc) (ref 1.0–2.5)
ALKALINE PHOSPHATASE (APISO): 41 U/L (ref 33–115)
ALT: 11 U/L (ref 6–29)
AST: 12 U/L (ref 10–35)
Albumin: 4.4 g/dL (ref 3.6–5.1)
BUN: 15 mg/dL (ref 7–25)
CALCIUM: 9 mg/dL (ref 8.6–10.2)
CO2: 26 mmol/L (ref 20–32)
Chloride: 106 mmol/L (ref 98–110)
Creat: 0.83 mg/dL (ref 0.50–1.10)
GFR, EST NON AFRICAN AMERICAN: 85 mL/min/{1.73_m2} (ref >=60)
GFR, Est African American: 99 mL/min/{1.73_m2} (ref >=60)
GLOBULIN: 2.1 g/dL (ref 1.9–3.7)
Glucose, Bld: 98 mg/dL (ref 65–99)
POTASSIUM: 4 mmol/L (ref 3.5–5.3)
SODIUM: 138 mmol/L (ref 135–146)
Total Bilirubin: 0.6 mg/dL (ref 0.2–1.2)
Total Protein: 6.5 g/dL (ref 6.1–8.1)

## 2017-11-17 LAB — LIPID PANEL
CHOLESTEROL: 153 mg/dL (ref <200)
HDL: 39 mg/dL — AB (ref >50)
LDL Cholesterol (Calc): 103 mg/dL (calc) — ABNORMAL HIGH
NON-HDL CHOLESTEROL (CALC): 114 mg/dL (ref <130)
Total CHOL/HDL Ratio: 3.9 (calc) (ref <5.0)
Triglycerides: 39 mg/dL (ref <150)

## 2017-11-17 LAB — VITAMIN B1: Vitamin B1 (Thiamine): 8 nmol/L (ref 8–30)

## 2017-11-17 LAB — TSH: TSH: 0.69 mIU/L

## 2017-11-17 LAB — FERRITIN: Ferritin: 65 ng/mL (ref 10–232)

## 2019-07-16 ENCOUNTER — Telehealth: Payer: Managed Care, Other (non HMO) | Admitting: Nurse Practitioner

## 2019-07-16 DIAGNOSIS — R21 Rash and other nonspecific skin eruption: Secondary | ICD-10-CM

## 2019-07-17 ENCOUNTER — Telehealth: Payer: Self-pay | Admitting: Family Medicine

## 2019-07-17 DIAGNOSIS — R21 Rash and other nonspecific skin eruption: Secondary | ICD-10-CM

## 2019-07-17 DIAGNOSIS — R590 Localized enlarged lymph nodes: Secondary | ICD-10-CM

## 2019-07-17 DIAGNOSIS — R233 Spontaneous ecchymoses: Secondary | ICD-10-CM

## 2019-07-17 NOTE — Telephone Encounter (Signed)
Patient reached out in regards to ED visit last night.  Concerned about new onset petechial rash as well as cervical lymphadenopathy.  Though has seen ENT for consultation for the lymphadenopathy and they recommended monitoring for this point at this point.  But considering future biopsy if swelling persists.  We will go ahead and order labs as below.  Nani Gasser, MD

## 2019-07-17 NOTE — Progress Notes (Signed)
Based on what you shared with me, I feel your condition warrants further evaluation and I recommend that you be seen for a face to face visit.  Please contact your primary care physician practice to be seen. Many offices offer virtual options to be seen via video if you are not comfortable going in person to a medical facility at this time.  If you do not have a PCP, [facility] offers a free physician referral service available at [phone]. Our trained staff has the experience, knowledge and resources to put you in touch with a physician who is right for you.   You also have the option of a video visit through [email or web address]  If you are having a true medical emergency please call 911.  NOTE: If you entered your credit card information for this eVisit, you will not be charged. You may see a "hold" on your card for the $35 but that hold will drop off and you will not have a charge processed.  Your e-visit answers were reviewed by a board certified advanced clinical practitioner to complete your personal care plan.  Thank you for using e-Visits.  I have spent at least 5 minutes reviewing and documenting in the patient's chart. 

## 2019-07-18 LAB — COMPLETE METABOLIC PANEL WITH GFR
AG Ratio: 2 (calc) (ref 1.0–2.5)
ALT: 28 U/L (ref 6–29)
AST: 19 U/L (ref 10–35)
Albumin: 4.6 g/dL (ref 3.6–5.1)
Alkaline phosphatase (APISO): 44 U/L (ref 31–125)
BUN: 15 mg/dL (ref 7–25)
CO2: 25 mmol/L (ref 20–32)
Calcium: 9.8 mg/dL (ref 8.6–10.2)
Chloride: 103 mmol/L (ref 98–110)
Creat: 0.86 mg/dL (ref 0.50–1.10)
GFR, Est African American: 94 mL/min/{1.73_m2} (ref >=60)
GFR, Est Non African American: 81 mL/min/{1.73_m2} (ref >=60)
Globulin: 2.3 g/dL (calc) (ref 1.9–3.7)
Glucose, Bld: 96 mg/dL (ref 65–99)
Potassium: 4 mmol/L (ref 3.5–5.3)
Sodium: 138 mmol/L (ref 135–146)
Total Bilirubin: 0.6 mg/dL (ref 0.2–1.2)
Total Protein: 6.9 g/dL (ref 6.1–8.1)

## 2019-07-18 LAB — CBC WITH DIFFERENTIAL/PLATELET
Absolute Monocytes: 360 cells/uL (ref 200–950)
Basophils Absolute: 18 cells/uL (ref 0–200)
Basophils Relative: 0.3 %
Eosinophils Absolute: 180 cells/uL (ref 15–500)
Eosinophils Relative: 3 %
HCT: 41.5 % (ref 35.0–45.0)
Hemoglobin: 13.4 g/dL (ref 11.7–15.5)
Lymphs Abs: 1104 cells/uL (ref 850–3900)
MCH: 28 pg (ref 27.0–33.0)
MCHC: 32.3 g/dL (ref 32.0–36.0)
MCV: 86.6 fL (ref 80.0–100.0)
MPV: 10.2 fL (ref 7.5–12.5)
Monocytes Relative: 6 %
Neutro Abs: 4338 cells/uL (ref 1500–7800)
Neutrophils Relative %: 72.3 %
Platelets: 271 10*3/uL (ref 140–400)
RBC: 4.79 10*6/uL (ref 3.80–5.10)
RDW: 12.5 % (ref 11.0–15.0)
Total Lymphocyte: 18.4 %
WBC: 6 10*3/uL (ref 3.8–10.8)

## 2019-07-18 LAB — PROTIME-INR
INR: 1
Prothrombin Time: 10.6 s (ref 9.0–11.5)

## 2019-07-18 LAB — LACTATE DEHYDROGENASE: LDH: 143 U/L (ref 100–200)

## 2019-10-15 ENCOUNTER — Encounter: Payer: Self-pay | Admitting: Family Medicine

## 2019-10-15 DIAGNOSIS — E559 Vitamin D deficiency, unspecified: Secondary | ICD-10-CM

## 2019-11-12 LAB — VITAMIN D 25 HYDROXY (VIT D DEFICIENCY, FRACTURES): Vit D, 25-Hydroxy: 49 ng/mL (ref 30–100)
